# Patient Record
Sex: Male | Born: 1975 | Race: White | Hispanic: No | Marital: Single | Smoking: Current every day smoker
Health system: Southern US, Community
[De-identification: ages and names within clinical notes are randomized; demographics above are authoritative.]

## PROBLEM LIST (undated history)

## (undated) DIAGNOSIS — G43909 Migraine, unspecified, not intractable, without status migrainosus: Secondary | ICD-10-CM

## (undated) DIAGNOSIS — T2200XA Burn of unspecified degree of shoulder and upper limb, except wrist and hand, unspecified site, initial encounter: Secondary | ICD-10-CM

## (undated) DIAGNOSIS — S52509A Unspecified fracture of the lower end of unspecified radius, initial encounter for closed fracture: Secondary | ICD-10-CM

## (undated) DIAGNOSIS — S62309A Unspecified fracture of unspecified metacarpal bone, initial encounter for closed fracture: Secondary | ICD-10-CM

## (undated) HISTORY — PX: NO PAST SURGERIES: SHX2092

---

## 2012-02-12 ENCOUNTER — Emergency Department (HOSPITAL_COMMUNITY): Payer: BC Managed Care – PPO

## 2012-02-12 ENCOUNTER — Encounter (HOSPITAL_COMMUNITY): Payer: Self-pay | Admitting: Emergency Medicine

## 2012-02-12 ENCOUNTER — Emergency Department (HOSPITAL_COMMUNITY)
Admission: EM | Admit: 2012-02-12 | Discharge: 2012-02-12 | Disposition: A | Discharge status: Home / Self Care | Payer: BC Managed Care – PPO | Attending: Emergency Medicine | Admitting: Emergency Medicine

## 2012-02-12 DIAGNOSIS — S62319A Displaced fracture of base of unspecified metacarpal bone, initial encounter for closed fracture: Secondary | ICD-10-CM | POA: Insufficient documentation

## 2012-02-12 DIAGNOSIS — S52501A Unspecified fracture of the lower end of right radius, initial encounter for closed fracture: Secondary | ICD-10-CM

## 2012-02-12 DIAGNOSIS — S52509A Unspecified fracture of the lower end of unspecified radius, initial encounter for closed fracture: Secondary | ICD-10-CM | POA: Insufficient documentation

## 2012-02-12 DIAGNOSIS — W2209XA Striking against other stationary object, initial encounter: Secondary | ICD-10-CM | POA: Insufficient documentation

## 2012-02-12 DIAGNOSIS — F17200 Nicotine dependence, unspecified, uncomplicated: Secondary | ICD-10-CM | POA: Insufficient documentation

## 2012-02-12 DIAGNOSIS — Y929 Unspecified place or not applicable: Secondary | ICD-10-CM | POA: Insufficient documentation

## 2012-02-12 DIAGNOSIS — S62309A Unspecified fracture of unspecified metacarpal bone, initial encounter for closed fracture: Secondary | ICD-10-CM

## 2012-02-12 DIAGNOSIS — Y939 Activity, unspecified: Secondary | ICD-10-CM | POA: Insufficient documentation

## 2012-02-12 DIAGNOSIS — S62306A Unspecified fracture of fifth metacarpal bone, right hand, initial encounter for closed fracture: Secondary | ICD-10-CM

## 2012-02-12 HISTORY — DX: Unspecified fracture of the lower end of unspecified radius, initial encounter for closed fracture: S52.509A

## 2012-02-12 HISTORY — DX: Unspecified fracture of unspecified metacarpal bone, initial encounter for closed fracture: S62.309A

## 2012-02-12 MED ORDER — HYDROCODONE-ACETAMINOPHEN 5-325 MG PO TABS: ORAL_TABLET | ORAL | Status: DC

## 2012-02-12 NOTE — Progress Notes (Signed)
Orthopedic Tech Progress Note Patient Details:  Tommy Hatfield 1975-12-23 [id number]  Ortho Devices Type of Ortho Device: Arm sling;Sugartong splint   [name] 02/12/2012, 6:05 AM
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
## 2012-02-12 NOTE — ED Notes (Signed)
Received bedside report from [place], [place].  Patient updated on plan of care; patient currently waiting to be transported to x-ray; denies any needs at this time. Will continue to monitor.

## 2012-02-12 NOTE — ED Notes (Signed)
Ortho called back; states that he is on his way.
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
## 2012-02-12 NOTE — ED Notes (Signed)
Paged ortho for splint and arm sling.

## 2012-02-12 NOTE — ED Notes (Signed)
Patient given copy of discharge paperwork; went over discharge instructions with patient.  Patient instructed to take Vicodin as directed, to not drive/operate heavy machinery while taking Vicodin, to follow up with hand surgeon first thing Monday morning, and to return to the ED for new, worsening, or concerning symptoms.

## 2012-02-12 NOTE — ED Notes (Signed)
Patient currently asleep in bed; no respiratory or acute distress noted.  Will continue to monitor. 

## 2012-02-12 NOTE — ED Notes (Signed)
Patient currently resting quietly in bed; no respiratory or acute distress noted.  Patient updated on plan of care; informed patient that we are currently waiting on EDP to come in and talk about test results; patient denies any needs at this time; will continue to monitor.

## 2012-02-17 ENCOUNTER — Encounter (HOSPITAL_BASED_OUTPATIENT_CLINIC_OR_DEPARTMENT_OTHER): Payer: Self-pay | Admitting: *Deleted

## 2012-02-17 DIAGNOSIS — T2200XA Burn of unspecified degree of shoulder and upper limb, except wrist and hand, unspecified site, initial encounter: Secondary | ICD-10-CM

## 2012-02-17 HISTORY — DX: Burn of unspecified degree of shoulder and upper limb, except wrist and hand, unspecified site, initial encounter: T22.00XA

## 2012-02-20 ENCOUNTER — Ambulatory Visit (HOSPITAL_BASED_OUTPATIENT_CLINIC_OR_DEPARTMENT_OTHER): Payer: BC Managed Care – PPO | Admitting: Certified Registered Nurse Anesthetist

## 2012-02-20 ENCOUNTER — Encounter (HOSPITAL_BASED_OUTPATIENT_CLINIC_OR_DEPARTMENT_OTHER): Payer: Self-pay | Admitting: Certified Registered Nurse Anesthetist

## 2012-02-20 ENCOUNTER — Encounter (HOSPITAL_BASED_OUTPATIENT_CLINIC_OR_DEPARTMENT_OTHER)
Admission: RE | Disposition: A | Discharge status: Home / Self Care | Payer: Self-pay | Source: Ambulatory Visit | Attending: Orthopedic Surgery

## 2012-02-20 ENCOUNTER — Encounter (HOSPITAL_BASED_OUTPATIENT_CLINIC_OR_DEPARTMENT_OTHER): Payer: Self-pay

## 2012-02-20 ENCOUNTER — Ambulatory Visit (HOSPITAL_BASED_OUTPATIENT_CLINIC_OR_DEPARTMENT_OTHER)
Admission: RE | Admit: 2012-02-20 | Discharge: 2012-02-20 | Disposition: A | Discharge status: Home / Self Care | Payer: BC Managed Care – PPO | Source: Ambulatory Visit | Attending: Orthopedic Surgery | Admitting: Orthopedic Surgery

## 2012-02-20 DIAGNOSIS — S52599A Other fractures of lower end of unspecified radius, initial encounter for closed fracture: Secondary | ICD-10-CM | POA: Insufficient documentation

## 2012-02-20 DIAGNOSIS — S52531A Colles' fracture of right radius, initial encounter for closed fracture: Secondary | ICD-10-CM

## 2012-02-20 DIAGNOSIS — S62339A Displaced fracture of neck of unspecified metacarpal bone, initial encounter for closed fracture: Secondary | ICD-10-CM | POA: Insufficient documentation

## 2012-02-20 DIAGNOSIS — IMO0002 Reserved for concepts with insufficient information to code with codable children: Secondary | ICD-10-CM | POA: Insufficient documentation

## 2012-02-20 HISTORY — PX: OPEN REDUCTION INTERNAL FIXATION (ORIF) DISTAL RADIAL FRACTURE: SHX5989

## 2012-02-20 HISTORY — DX: Unspecified fracture of the lower end of unspecified radius, initial encounter for closed fracture: S52.509A

## 2012-02-20 HISTORY — PX: CLOSED REDUCTION FINGER WITH PERCUTANEOUS PINNING: SHX5612

## 2012-02-20 HISTORY — DX: Unspecified fracture of unspecified metacarpal bone, initial encounter for closed fracture: S62.309A

## 2012-02-20 HISTORY — DX: Burn of unspecified degree of shoulder and upper limb, except wrist and hand, unspecified site, initial encounter: T22.00XA

## 2012-02-20 LAB — POCT HEMOGLOBIN-HEMACUE: Hemoglobin: 16.7 g/dL (ref 13.0–17.0)

## 2012-02-20 SURGERY — OPEN REDUCTION INTERNAL FIXATION (ORIF) DISTAL RADIUS FRACTURE
Anesthesia: Regional | Site: Wrist | Laterality: Right | Wound class: Clean

## 2012-02-20 MED ORDER — FENTANYL CITRATE 0.05 MG/ML IJ SOLN
100.0000 ug | Freq: Once | INTRAMUSCULAR | Status: AC
  Administered 2012-02-20: 100 ug via INTRAVENOUS

## 2012-02-20 MED ORDER — MIDAZOLAM HCL 5 MG/5ML IJ SOLN
INTRAMUSCULAR | Status: DC | PRN
  Administered 2012-02-20: 2 mg via INTRAVENOUS

## 2012-02-20 MED ORDER — LACTATED RINGERS IV SOLN
INTRAVENOUS | Status: DC
  Administered 2012-02-20 (×2): via INTRAVENOUS

## 2012-02-20 MED ORDER — MEPERIDINE HCL 25 MG/ML IJ SOLN: 6.2500 mg | INTRAMUSCULAR | Status: DC | PRN

## 2012-02-20 MED ORDER — OXYCODONE-ACETAMINOPHEN 5-325 MG PO TABS: 1.0000 | ORAL_TABLET | ORAL | Status: DC | PRN

## 2012-02-20 MED ORDER — BUPIVACAINE-EPINEPHRINE PF 0.5-1:200000 % IJ SOLN
INTRAMUSCULAR | Status: DC | PRN
  Administered 2012-02-20: 30 mL

## 2012-02-20 MED ORDER — DEXAMETHASONE SODIUM PHOSPHATE 4 MG/ML IJ SOLN
INTRAMUSCULAR | Status: DC | PRN
  Administered 2012-02-20: 10 mg via INTRAVENOUS

## 2012-02-20 MED ORDER — ONDANSETRON HCL 4 MG/2ML IJ SOLN: 4.0000 mg | Freq: Once | INTRAMUSCULAR | Status: DC | PRN

## 2012-02-20 MED ORDER — MIDAZOLAM HCL 2 MG/2ML IJ SOLN
2.0000 mg | Freq: Once | INTRAMUSCULAR | Status: AC
  Administered 2012-02-20: 2 mg via INTRAVENOUS

## 2012-02-20 MED ORDER — ONDANSETRON HCL 4 MG/2ML IJ SOLN
INTRAMUSCULAR | Status: DC | PRN
  Administered 2012-02-20: 4 mg via INTRAVENOUS

## 2012-02-20 MED ORDER — OXYCODONE HCL 5 MG PO TABS: 5.0000 mg | ORAL_TABLET | Freq: Once | ORAL | Status: DC | PRN

## 2012-02-20 MED ORDER — 0.9 % SODIUM CHLORIDE (POUR BTL) OPTIME
TOPICAL | Status: DC | PRN
  Administered 2012-02-20: 1000 mL

## 2012-02-20 MED ORDER — FENTANYL CITRATE 0.05 MG/ML IJ SOLN
INTRAMUSCULAR | Status: DC | PRN
  Administered 2012-02-20: 25 ug via INTRAVENOUS

## 2012-02-20 MED ORDER — HYDROMORPHONE HCL PF 1 MG/ML IJ SOLN: 0.2500 mg | INTRAMUSCULAR | Status: DC | PRN

## 2012-02-20 MED ORDER — CEFAZOLIN SODIUM-DEXTROSE 2-3 GM-% IV SOLR
INTRAVENOUS | Status: DC | PRN
  Administered 2012-02-20: 2 g via INTRAVENOUS

## 2012-02-20 MED ORDER — PROPOFOL 10 MG/ML IV BOLUS
INTRAVENOUS | Status: DC | PRN
  Administered 2012-02-20: 150 mg via INTRAVENOUS

## 2012-02-20 MED ORDER — LIDOCAINE HCL (CARDIAC) 20 MG/ML IV SOLN
INTRAVENOUS | Status: DC | PRN
  Administered 2012-02-20: 60 mg via INTRAVENOUS

## 2012-02-20 MED ORDER — OXYCODONE HCL 5 MG/5ML PO SOLN: 5.0000 mg | Freq: Once | ORAL | Status: DC | PRN

## 2012-02-20 SURGICAL SUPPLY — 72 items
APL SKNCLS STERI-STRIP NONHPOA (GAUZE/BANDAGES/DRESSINGS) ×2
BAG DECANTER FOR FLEXI CONT (MISCELLANEOUS) IMPLANT
BANDAGE ELASTIC 3 VELCRO ST LF (GAUZE/BANDAGES/DRESSINGS) IMPLANT
BANDAGE ELASTIC 4 VELCRO ST LF (GAUZE/BANDAGES/DRESSINGS) ×3 IMPLANT
BANDAGE GAUZE ELAST BULKY 4 IN (GAUZE/BANDAGES/DRESSINGS) ×3 IMPLANT
BENZOIN TINCTURE PRP APPL 2/3 (GAUZE/BANDAGES/DRESSINGS) ×3 IMPLANT
BIT DRILL 2 FAST STEP (BIT) ×3 IMPLANT
BIT DRILL 2.5X4 QC (BIT) ×3 IMPLANT
BLADE MINI RND TIP GREEN BEAV (BLADE) IMPLANT
BLADE SURG 15 STRL LF DISP TIS (BLADE) ×4 IMPLANT
BLADE SURG 15 STRL SS (BLADE) ×6
BNDG CMPR 9X4 STRL LF SNTH (GAUZE/BANDAGES/DRESSINGS) ×2
BNDG ESMARK 4X9 LF (GAUZE/BANDAGES/DRESSINGS) ×3 IMPLANT
CANISTER SUCTION 1200CC (MISCELLANEOUS) IMPLANT
CLOTH BEACON ORANGE TIMEOUT ST (SAFETY) ×3 IMPLANT
CORDS BIPOLAR (ELECTRODE) ×3 IMPLANT
COVER TABLE BACK 60X90 (DRAPES) ×3 IMPLANT
CUFF TOURNIQUET SINGLE 18IN (TOURNIQUET CUFF) ×3 IMPLANT
DECANTER SPIKE VIAL GLASS SM (MISCELLANEOUS) IMPLANT
DRAPE EXTREMITY T 121X128X90 (DRAPE) ×3 IMPLANT
DRAPE OEC MINIVIEW 54X84 (DRAPES) ×3 IMPLANT
DRAPE SURG 17X23 STRL (DRAPES) ×3 IMPLANT
DURAPREP 26ML APPLICATOR (WOUND CARE) ×3 IMPLANT
ELECT REM PT RETURN 9FT ADLT (ELECTROSURGICAL)
ELECTRODE REM PT RTRN 9FT ADLT (ELECTROSURGICAL) IMPLANT
GAUZE SPONGE 4X4 16PLY XRAY LF (GAUZE/BANDAGES/DRESSINGS) IMPLANT
GAUZE XEROFORM 1X8 LF (GAUZE/BANDAGES/DRESSINGS) IMPLANT
GLOVE BIO SURGEON STRL SZ8.5 (GLOVE) ×3 IMPLANT
GLOVE BIOGEL M STRL SZ7.5 (GLOVE) ×3 IMPLANT
GLOVE BIOGEL PI IND STRL 8 (GLOVE) ×2 IMPLANT
GLOVE BIOGEL PI INDICATOR 8 (GLOVE) ×1
GOWN PREVENTION PLUS XLARGE (GOWN DISPOSABLE) IMPLANT
GOWN PREVENTION PLUS XXLARGE (GOWN DISPOSABLE) ×3 IMPLANT
GOWN STRL REIN XL XLG (GOWN DISPOSABLE) ×3 IMPLANT
K-WIRE .045X4 (WIRE) ×6 IMPLANT
NEEDLE HYPO 25X1 1.5 SAFETY (NEEDLE) IMPLANT
NS IRRIG 1000ML POUR BTL (IV SOLUTION) ×3 IMPLANT
PACK BASIN DAY SURGERY FS (CUSTOM PROCEDURE TRAY) ×3 IMPLANT
PAD CAST 3X4 CTTN HI CHSV (CAST SUPPLIES) ×2 IMPLANT
PAD CAST 4YDX4 CTTN HI CHSV (CAST SUPPLIES) IMPLANT
PADDING CAST ABS 4INX4YD NS (CAST SUPPLIES)
PADDING CAST ABS COTTON 4X4 ST (CAST SUPPLIES) IMPLANT
PADDING CAST COTTON 3X4 STRL (CAST SUPPLIES) ×3
PADDING CAST COTTON 4X4 STRL (CAST SUPPLIES)
PEG SUBCHONDRAL SMOOTH 2.0X22 (Peg) ×6 IMPLANT
PEG SUBCHONDRAL SMOOTH 2.0X24 (Peg) ×9 IMPLANT
PEG SUBCHONDRAL SMOOTH 2.0X26 (Peg) ×3 IMPLANT
PENCIL BUTTON HOLSTER BLD 10FT (ELECTRODE) IMPLANT
PLATE STAN 24.4X59.5 RT (Plate) ×3 IMPLANT
SCREW CORT 3.5X14 LNG (Screw) ×6 IMPLANT
SCREW CORT 3.5X16 LNG (Screw) ×3 IMPLANT
SHEET MEDIUM DRAPE 40X70 STRL (DRAPES) ×6 IMPLANT
SPLINT PLASTER CAST XFAST 3X15 (CAST SUPPLIES) IMPLANT
SPLINT PLASTER CAST XFAST 4X15 (CAST SUPPLIES) ×30 IMPLANT
SPLINT PLASTER XTRA FAST SET 4 (CAST SUPPLIES) ×15
SPLINT PLASTER XTRA FASTSET 3X (CAST SUPPLIES)
SPONGE GAUZE 4X4 12PLY (GAUZE/BANDAGES/DRESSINGS) ×3 IMPLANT
STOCKINETTE 4X48 STRL (DRAPES) ×3 IMPLANT
STRIP CLOSURE SKIN 1/2X4 (GAUZE/BANDAGES/DRESSINGS) ×45 IMPLANT
SUCTION FRAZIER TIP 10 FR DISP (SUCTIONS) IMPLANT
SUT ETHILON 4 0 PS 2 18 (SUTURE) IMPLANT
SUT MERSILENE 4 0 P 3 (SUTURE) IMPLANT
SUT PROLENE 3 0 PS 2 (SUTURE) IMPLANT
SUT SILK 2 0 FS (SUTURE) IMPLANT
SUT VIC AB 3-0 FS2 27 (SUTURE) IMPLANT
SUT VICRYL RAPIDE 4/0 PS 2 (SUTURE) IMPLANT
SYR BULB 3OZ (MISCELLANEOUS) ×3 IMPLANT
SYRINGE 10CC LL (SYRINGE) IMPLANT
TOWEL OR 17X24 6PK STRL BLUE (TOWEL DISPOSABLE) ×3 IMPLANT
TUBE CONNECTING 20X1/4 (TUBING) IMPLANT
UNDERPAD 30X30 INCONTINENT (UNDERPADS AND DIAPERS) ×3 IMPLANT
WATER STERILE IRR 1000ML POUR (IV SOLUTION) IMPLANT

## 2012-02-20 NOTE — Anesthesia Preprocedure Evaluation (Addendum)

## 2012-02-20 NOTE — Progress Notes (Signed)
Assisted Dr. Ossey with right, ultrasound guided, supraclavicular block. Side rails up, monitors on throughout procedure. See vital signs in flow sheet. Tolerated Procedure well. 

## 2012-02-20 NOTE — Transfer of Care (Signed)
Immediate Anesthesia Transfer of Care Note  Patient: Tommy Hatfield  Procedure(s) Performed: Procedure(s) with comments: OPEN REDUCTION INTERNAL FIXATION (ORIF) DISTAL RADIAL FRACTURE (Right) - AND PINNING OF 5TH METACARPAL RIGHT SIDE  Patient Location: PACU  Anesthesia Type:GA combined with regional for post-op pain  Level of Consciousness: awake and patient cooperative  Airway & Oxygen Therapy: Patient Spontanous Breathing and Patient connected to face mask oxygen  Post-op Assessment: Report given to PACU RN and Post -op Vital signs reviewed and stable  Post vital signs: Reviewed and stable  Complications: No apparent anesthesia complications
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
## 2012-02-20 NOTE — Anesthesia Procedure Notes (Addendum)
Anesthesia Regional Block:  Supraclavicular block  Pre-Anesthetic Checklist: ,, timeout performed, Correct Patient, Correct Site, Correct Laterality, Correct Procedure, Correct Position, site marked, Risks and benefits discussed,  Surgical consent,  Pre-op evaluation,  At surgeon's request and post-op pain management  Laterality: Right  Prep: chloraprep       Needles:   Needle Type: Echogenic Stimulator Needle     Needle Length: 9cm      Additional Needles:  Procedures: ultrasound guided (picture in chart) and nerve stimulator Supraclavicular block  Nerve Stimulator or Paresthesia:  Response: 0.4 mA,   Additional Responses:   Narrative:  Start time: 02/20/2012 1:16 PM End time: 02/20/2012 1:25 PM Injection made incrementally with aspirations every 5 mL.  Performed by: Personally  Anesthesiologist: Arta Bruce MD  Additional Notes: Monitors applied. Patient sedated. Sterile prep and drape,hand hygiene and sterile gloves were used. Relevant anatomy identified.Needle position confirmed.Local anesthetic injected incrementally after negative aspiration. Local anesthetic spread visualized around nerve(s). Vascular puncture avoided. No complications. Image printed for medical record.The patient tolerated the procedure well.       Supraclavicular block Procedure Name: LMA Insertion Date/Time: 02/20/2012 3:09 PM Performed by: Lequita Meadowcroft D Pre-anesthesia Checklist: Patient identified, Emergency Drugs available, Suction available and Patient being monitored Patient Re-evaluated:Patient Re-evaluated prior to inductionOxygen Delivery Method: Circle System Utilized Preoxygenation: Pre-oxygenation with 100% oxygen Intubation Type: IV induction Ventilation: Mask ventilation without difficulty LMA: LMA inserted LMA Size: 5.0 Number of attempts: 1 Airway Equipment and Method: bite block Placement Confirmation: positive ETCO2 Tube secured with: Tape Dental Injury: Teeth and  Oropharynx as per pre-operative assessment

## 2012-02-20 NOTE — Op Note (Signed)
See dictated note [phone]

## 2012-02-21 NOTE — Anesthesia Postprocedure Evaluation (Signed)
Anesthesia Post Note  Patient: Tommy Hatfield  Procedure(s) Performed: Procedure(s) (LRB): OPEN REDUCTION INTERNAL FIXATION (ORIF) DISTAL RADIAL FRACTURE (Right) CLOSED REDUCTION FINGER WITH PERCUTANEOUS PINNING (Right)  Anesthesia type: general  Patient location: PACU  Post pain: Pain level controlled  Post assessment: Patient's Cardiovascular Status Stable  Last Vitals:  Filed Vitals:   02/20/12 1714  BP: 123/77  Pulse: 92  Temp: 37 C  Resp: 16    Post vital signs: Reviewed and stable  Level of consciousness: sedated  Complications: No apparent anesthesia complications

## 2012-02-21 NOTE — Op Note (Signed)
Tommy Hatfield, Tommy Hatfield              ACCOUNT NO.:  [id number]  MEDICAL RECORD NO.:  [id number]  LOCATION:                                 FACILITY:  PHYSICIAN:  Artist Pais. Raahil Ong, M.D.DATE OF BIRTH:  02/10/1975  DATE OF PROCEDURE:  02/20/2012 DATE OF DISCHARGE:                              OPERATIVE REPORT   PREOPERATIVE DIAGNOSIS:  Displaced intra-articular fracture distal radius, right side and displaced small metacarpal fracture, right side.  POSTOPERATIVE DIAGNOSIS:  Displaced intra-articular fracture distal radius, right side and displaced small metacarpal fracture, right side.  PROCEDURE:  Open reduction and internal fixation, right distal radius fracture with DVR plate and screws, release of brachioradialis muscle, and then closed reduction and percutaneous pinning of small metacarpal fracture, right side.  SURGEON:  Artist Pais. Mina Marble, M.D.  ASSISTANT:  None.  ANESTHESIA:  Supraclavicular block and general.  COMPLICATIONS:  No complications.  DRAINS:  No drains.  DESCRIPTION OF PROCEDURE:  The patient was taken operating suite.  After induction of adequate general anesthesia, right upper extremity was prepped and draped in usual sterile fashion.  An Esmarch was used to exsanguinate the limb.  Tourniquet was inflated to 250 mmHg.  At this point in time, a standard volar approach to the distal radius was undertaken.  Skin was incised between the FCR and the radial artery. This level was developed down the level of pronator quadratus, it was subperiosteally split revealing a volarly displaced three-part distal radius fracture.  The brachioradialis was released off the distal fragment to aid in reduction.  Reduction was performed with extension over some rolled up towels.  Standard DVR plate was placed on the lower aspect, and a drill was used to make a hole through the slotted hole. The plate was then fastened down to the volar aspect of the distal radius reducing  the fracture.  It was adjusted using fluoroscopy.  Two cortical screws were placed proximally, 2 more cortical screws proximally followed by smooth pegs distally.  Intraoperative fluoroscopy revealed adequate reduction in the AP, lateral, and oblique view.  The wound was irrigated and loosely closed in layers of 2-0 undyed Vicryl for the pronator, 4-0 Vicryl Rapide subcuticular stitch on the skin. Steri-Strips were applied.  The patient then had a closed reduction of the small finger metacarpal fracture using a Jahss maneuver.  Two 0.45 K- wires were driven from ulnar to radial from the 5th metacarpal head and neck junction into the 4th to maintain reduction.  The K-wires were cut outside the skin, dressed with Xeroform.  The patient was then placed in sterile dressing of 4x4s, fluffs, and a volar splint.  The patient tolerated the procedure well and went to the recovery room in a stable fashion.     Artist Pais Mina Marble, M.D.     MAW/MEDQ  D:  02/20/2012  T:  02/21/2012  Job:  [id number]

## 2012-02-22 ENCOUNTER — Encounter (HOSPITAL_BASED_OUTPATIENT_CLINIC_OR_DEPARTMENT_OTHER): Payer: Self-pay | Admitting: Orthopedic Surgery

## 2013-05-01 ENCOUNTER — Emergency Department (HOSPITAL_COMMUNITY)
Admission: EM | Admit: 2013-05-01 | Discharge: 2013-05-01 | Disposition: A | Discharge status: Home / Self Care | Payer: BC Managed Care – PPO | Attending: Emergency Medicine | Admitting: Emergency Medicine

## 2013-05-01 ENCOUNTER — Encounter (HOSPITAL_COMMUNITY): Payer: Self-pay | Admitting: Emergency Medicine

## 2013-05-01 DIAGNOSIS — F17200 Nicotine dependence, unspecified, uncomplicated: Secondary | ICD-10-CM | POA: Insufficient documentation

## 2013-05-01 DIAGNOSIS — Z8679 Personal history of other diseases of the circulatory system: Secondary | ICD-10-CM | POA: Insufficient documentation

## 2013-05-01 DIAGNOSIS — Z8781 Personal history of (healed) traumatic fracture: Secondary | ICD-10-CM | POA: Insufficient documentation

## 2013-05-01 DIAGNOSIS — Z87828 Personal history of other (healed) physical injury and trauma: Secondary | ICD-10-CM | POA: Insufficient documentation

## 2013-05-01 DIAGNOSIS — G44009 Cluster headache syndrome, unspecified, not intractable: Secondary | ICD-10-CM | POA: Insufficient documentation

## 2013-05-01 HISTORY — DX: Migraine, unspecified, not intractable, without status migrainosus: G43.909

## 2013-05-01 MED ORDER — SODIUM CHLORIDE 0.9 % IV SOLN
1000.0000 mL | INTRAVENOUS | Status: DC
  Administered 2013-05-01: 1000 mL via INTRAVENOUS

## 2013-05-01 MED ORDER — DEXAMETHASONE SODIUM PHOSPHATE 10 MG/ML IJ SOLN
10.0000 mg | Freq: Once | INTRAMUSCULAR | Status: AC
  Administered 2013-05-01: 10 mg via INTRAMUSCULAR
  Filled 2013-05-01: qty 1

## 2013-05-01 MED ORDER — KETOROLAC TROMETHAMINE 30 MG/ML IJ SOLN
30.0000 mg | Freq: Once | INTRAMUSCULAR | Status: DC
Start: 2013-05-01 — End: 2013-05-01

## 2013-05-01 MED ORDER — METOCLOPRAMIDE HCL 5 MG/ML IJ SOLN
10.0000 mg | INTRAMUSCULAR | Status: AC
  Administered 2013-05-01: 10 mg via INTRAMUSCULAR
  Filled 2013-05-01: qty 2

## 2013-05-01 MED ORDER — KETOROLAC TROMETHAMINE 30 MG/ML IJ SOLN
30.0000 mg | Freq: Once | INTRAMUSCULAR | Status: AC
  Administered 2013-05-01: 30 mg via INTRAVENOUS
  Filled 2013-05-01: qty 1

## 2013-05-01 MED ORDER — KETOROLAC TROMETHAMINE 10 MG PO TABS: 10.0000 mg | ORAL_TABLET | Freq: Four times a day (QID) | ORAL | Status: AC | PRN

## 2013-05-01 MED ORDER — SODIUM CHLORIDE 0.9 % IV SOLN
1000.0000 mL | Freq: Once | INTRAVENOUS | Status: AC
  Administered 2013-05-01: 1000 mL via INTRAVENOUS

## 2013-05-01 NOTE — ED Notes (Signed)
Pt. reports progressing migraine headache onset this evening , no nausea or vomitting .

## 2013-05-01 NOTE — Discharge Instructions (Signed)
Cluster Headache Cluster headaches are recognized by their pattern of deep, intense head pain. They normally occur on one side of your head, but they may "switch sides" in subsequent episodes. Typically, cluster headaches:   Are severe in nature.   Occur repeatedly over weeks to months and are followed by periods of no headaches.   Can last from 15 minutes to 3 hours.   Occur at the same time each day, often at night.   Occur several times a day. CAUSES The exact cause of cluster headaches is not known. Alcohol use may be associated with cluster headaches. SIGNS AND SYMPTOMS   Severe pain that begins in or around your eye or temple.   One-sided head pain.   Feeling sick to your stomach (nauseous).   Sensitivity to light.   Runny nose.   Eye redness, tearing, and nasal stuffiness on the side of your head where you are experiencing pain.   Sweaty, pale skin of the face.   Droopy or swollen eyelid.   Restlessness. DIAGNOSIS  Cluster headaches are diagnosed based on symptoms and a physical exam. Your health care provider may order a CT scan or an MRI of your head or lab tests to see if your headaches are caused by other medical conditions.  TREATMENT   Medicines for pain relief and to prevent recurrent attacks. Some people may need a combination of medicines.  Oxygen for pain relief.   Biofeedback programs to help reduce headache pain.  It may be helpful to keep a headache diary. This may help you find a trend for what is triggering your headaches. Your health care provider can develop a treatment plan.  HOME CARE INSTRUCTIONS  During cluster periods:   Follow a regular sleep schedule. Do not vary the amount and time that you sleep from day to day. It is important to stay on the same schedule during a cluster period to help prevent headaches.   Avoid alcohol.   Stop smoking if you smoke.  SEEK MEDICAL CARE IF:  You have any changes from your previous  cluster headaches either in intensity or frequency.   You are not getting relief from medicines you are taking.  SEEK IMMEDIATE MEDICAL CARE IF:   You faint.   You have weakness or numbness, especially on one side of your body or face.   You have double vision.   You have nausea or vomiting that is not relieved within several hours.   You cannot keep your balance or have difficulty talking or walking.   You have neck pain or stiffness.   You have a fever. MAKE SURE YOU:  Understand these instructions.   Will watch your condition.   Will get help right away if you are not doing well or get worse. Document Released: 12/27/2004 Document Revised: 10/17/2012 Document Reviewed: 07/19/2012 [facility] Patient Information 2014 [employer], [place].  Ketorolac tablets What is this medicine? KETOROLAC (kee toe ROLE ak) is a non-steroidal anti-inflammatory drug (NSAID). It is used for a short while to treat moderate to severe pain, including pain after surgery. It should not be used for more than 5 days. This medicine may be used for other purposes; ask your health care provider or pharmacist if you have questions. COMMON BRAND NAME(S): Toradol What should I tell my health care provider before I take this medicine? They need to know if you have any of these conditions: -asthma -bleeding problems like hemophilia -cigarette smoker -drink more than 3 alcohol containing drinks a  day -heart disease or circulation problems such as heart failure or leg edema (fluid retention) -high blood pressure -kidney disease -liver disease -stomach bleeding or ulcers -an unusual or allergic reaction to ketorolac, aspirin, other NSAIDs, other medicines, foods, dyes, or preservatives -pregnant or trying to get pregnant -breast-feeding How should I use this medicine? Take this medicine by mouth with a full glass of water. Follow the directions on the prescription label. Take your medicine at regular  intervals. Do not take your medicine more often than directed. Do not take more than the recommended dose. A special MedGuide will be given to you by the pharmacist with each prescription and refill. Be sure to read this information carefully each time. Talk to your pediatrician regarding the use of this medicine in children. While this drug may be prescribed for children as young as 39 years of age for selected conditions, precautions do apply. Patients over 39 years old may have a stronger reaction and need a smaller dose. Overdosage: If you think you have taken too much of this medicine contact a poison control center or emergency room at once. NOTE: This medicine is only for you. Do not share this medicine with others. What if I miss a dose? If you miss a dose, take it as soon as you can. If it is almost time for your next dose, take only that dose. Do not take double or extra doses. What may interact with this medicine? Do not take this medicine with any of the following medications: -aspirin and aspirin-like medicines -cidofovir -methotrexate -NSAIDs, medicines for pain and inflammation, like ibuprofen or naproxen -pemetrexed -probenecid This medicine may also interact with the following medications: -alcohol -alendronate -alprazolam -carbamazepine -cyclosporine -diuretics -flavocoxid -fluoxetine -ginkgo -lithium -medicines for high blood pressure like enalapril -medicines that affect platelets like pentoxifylline -medicines that treat or prevent blood clots like heparin, warfarin -muscle relaxants -phenytoin -steroid medicines like prednisone or cortisone -thiothixene This list may not describe all possible interactions. Give your health care provider a list of all the medicines, herbs, non-prescription drugs, or dietary supplements you use. Also tell them if you smoke, drink alcohol, or use illegal drugs. Some items may interact with your medicine. What should I watch for  while using this medicine? Tell your doctor or health care professional if your pain does not get better. Talk to your doctor before taking another medicine for pain. Do not treat yourself. This medicine does not prevent heart attack or stroke. In fact, this medicine may increase the chance of a heart attack or stroke. The chance may increase with longer use of this medicine and in people who have heart disease. If you take aspirin to prevent heart attack or stroke, talk with your doctor or health care professional. Do not take medicines such as ibuprofen and naproxen with this medicine. Side effects such as stomach upset, nausea, or ulcers may be more likely to occur. Many medicines available without a prescription should not be taken with this medicine. This medicine can cause ulcers and bleeding in the stomach and intestines at any time during treatment. Do not smoke cigarettes or drink alcohol. These increase irritation to your stomach and can make it more susceptible to damage from this medicine. Ulcers and bleeding can happen without warning symptoms and can cause death. You may get drowsy or dizzy. Do not drive, use machinery, or do anything that needs mental alertness until you know how this medicine affects you. Do not stand or sit up quickly, especially  if you are an older patient. This reduces the risk of dizzy or fainting spells. This medicine can cause you to bleed more easily. Try to avoid damage to your teeth and gums when you brush or floss your teeth. What side effects may I notice from receiving this medicine? Side effects that you should report to your doctor or health care professional as soon as possible: -allergic reactions like skin rash, itching or hives, swelling of the face, lips, or tongue -breathing problems -high blood pressure -nausea, vomiting -redness, blistering, peeling or loosening of the skin, including inside the mouth -severe stomach pain -signs and symptoms of  bleeding such as bloody or black, tarry stools; red or dark-brown urine; spitting up blood or brown material that looks like coffee grounds; red spots on the skin; unusual bruising or bleeding from the eye, gums, or nose -signs and symptoms of a stroke like changes in vision; confusion; trouble speaking or understanding; severe headaches; sudden numbness or weakness of the face, arm or leg; trouble walking; dizziness; loss of balance or coordination -trouble passing urine or change in the amount of urine -unexplained weight gain or swelling -unusually weak or tired -yellowing of eyes or skin  Side effects that usually do not require medical attention (report to your doctor or health care professional if they continue or are bothersome): -diarrhea -dizziness -headache -heartburn This list may not describe all possible side effects. Call your doctor for medical advice about side effects. You may report side effects to FDA at 1-800-FDA-1088. Where should I keep my medicine? Keep out of the reach of children. Store at room temperature between 20 and 25 degrees C (68 and 77 degrees F). Throw away any unused medicine after the expiration date. NOTE: This sheet is a summary. It may not cover all possible information. If you have questions about this medicine, talk to your doctor, pharmacist, or health care provider.  2014, [employer]. (2012-05-15 16:36:05)
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
## 2013-06-30 IMAGING — CR DG WRIST COMPLETE 3+V*R*
4 series · 4 of 4 positions shown · non-contrast
Comparison: None.

CLINICAL DATA: Wrist pain after injury.

RIGHT WRIST - COMPLETE 3+ VIEW

[x wrist obl right]
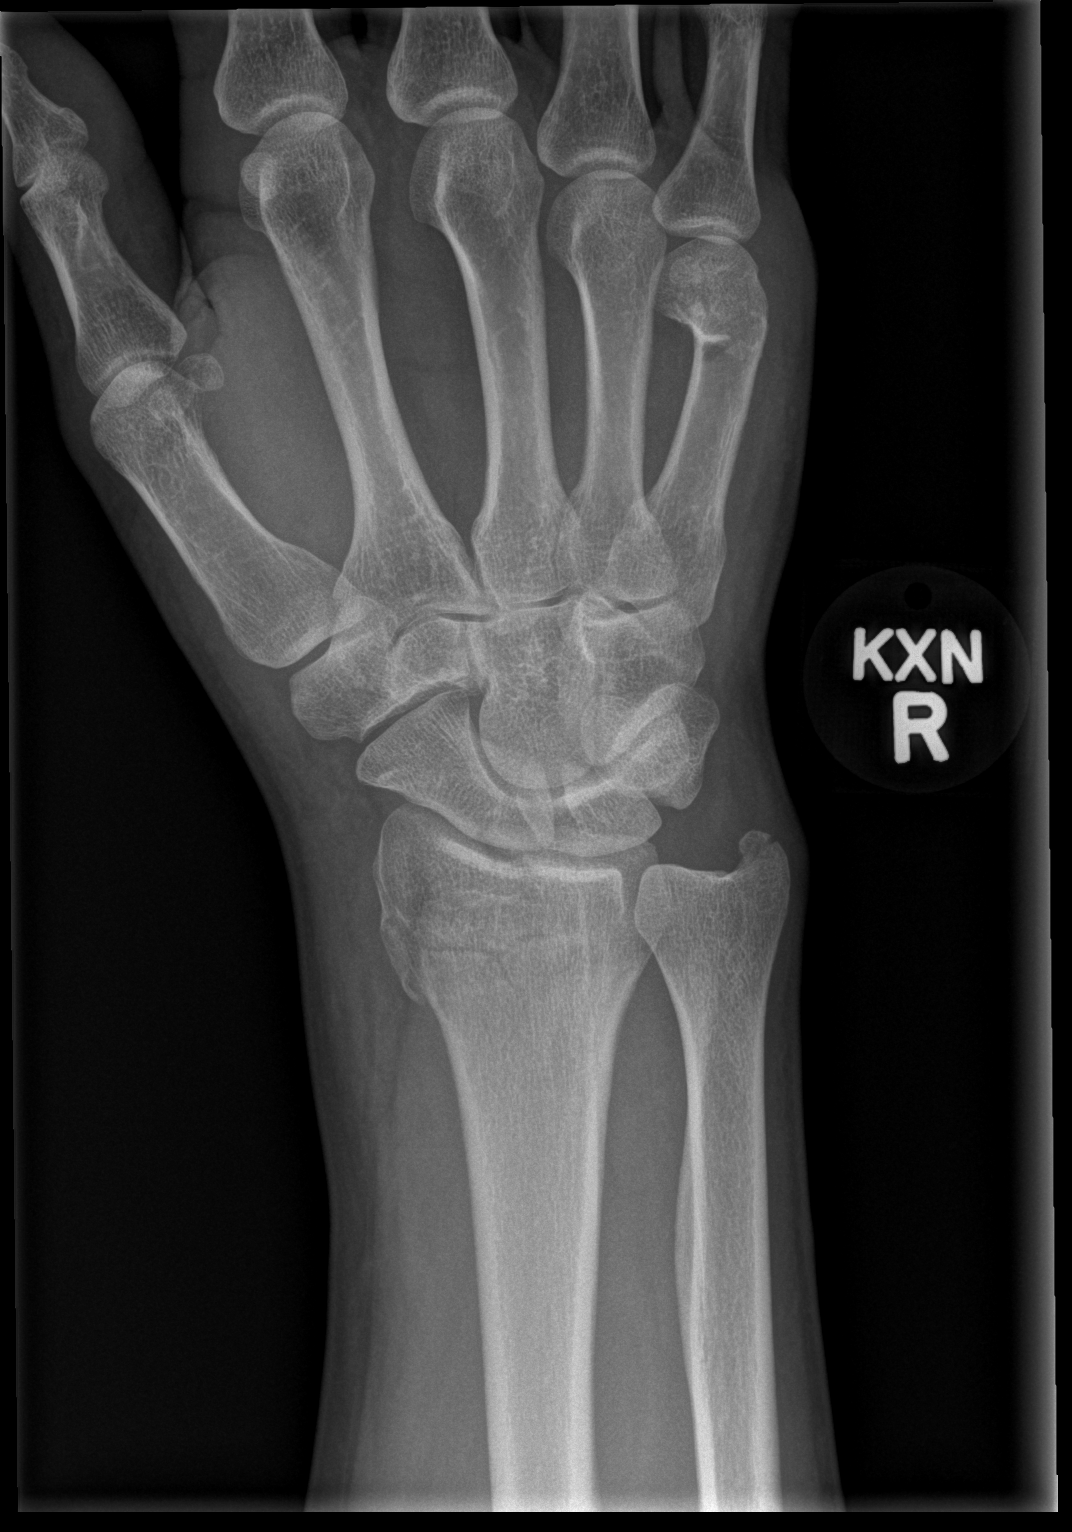

[x wrist pa right]
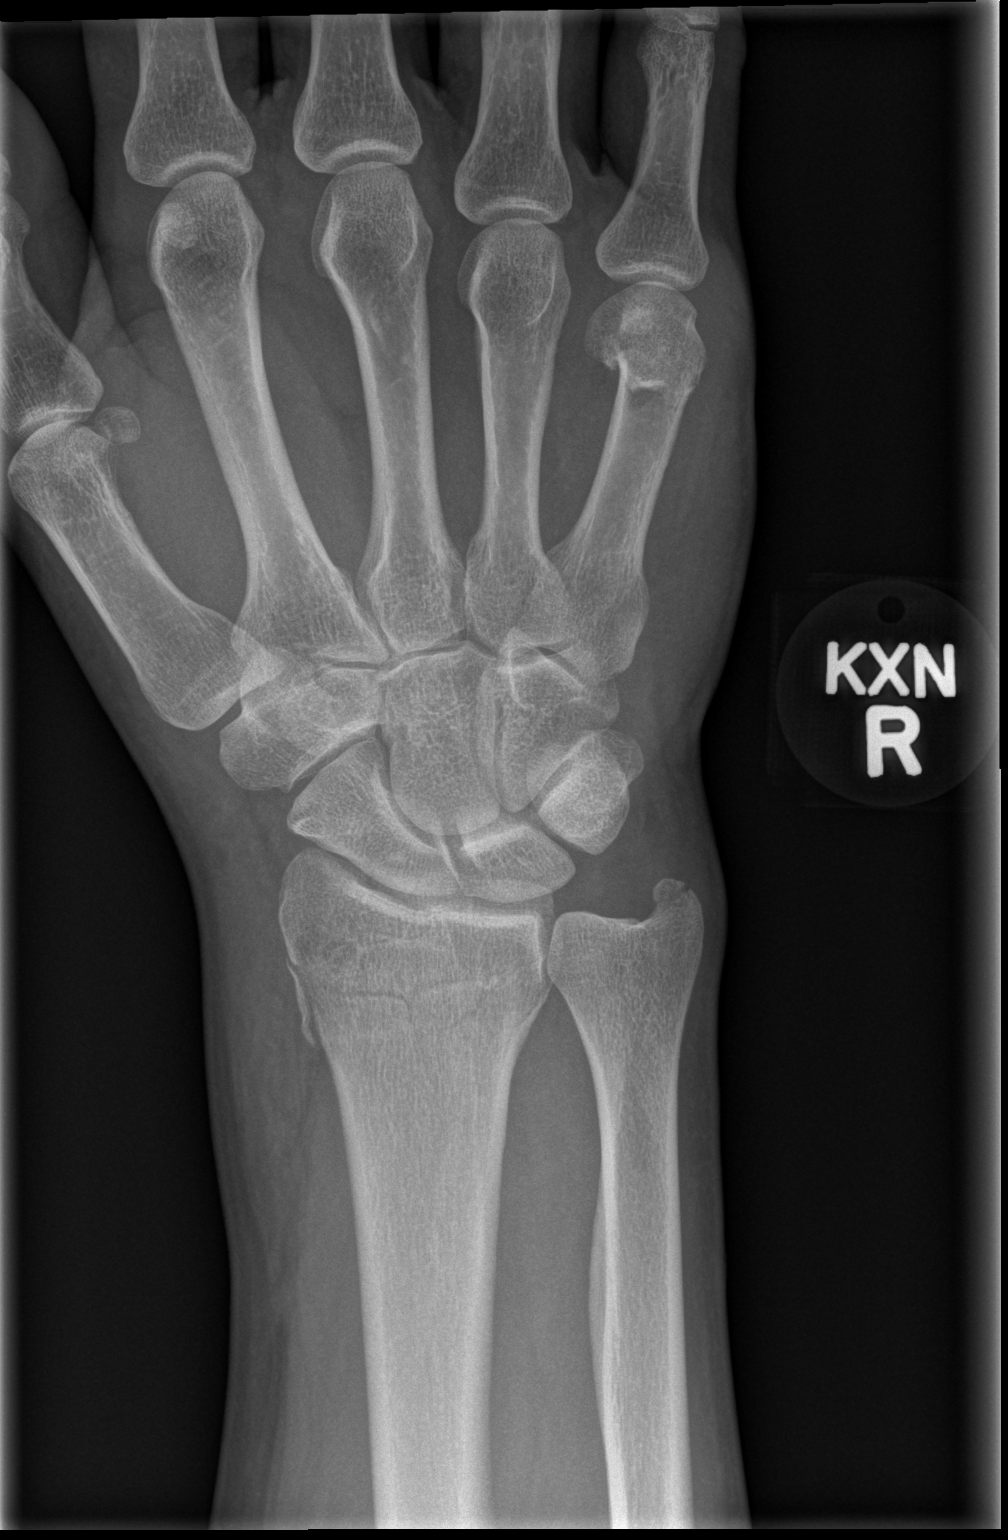

[x wrist navicular view right]
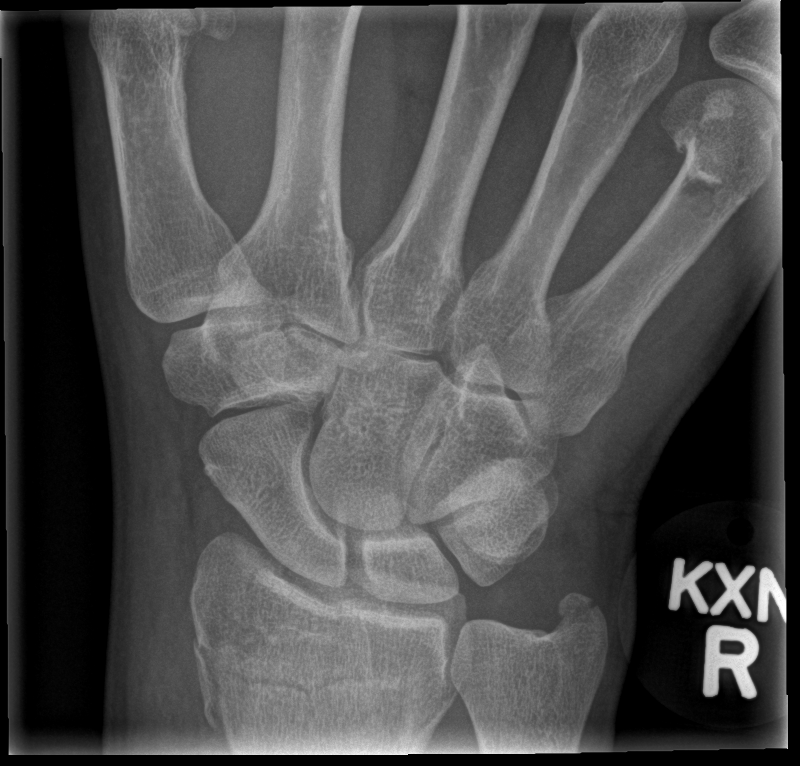

[x wrist lat right]
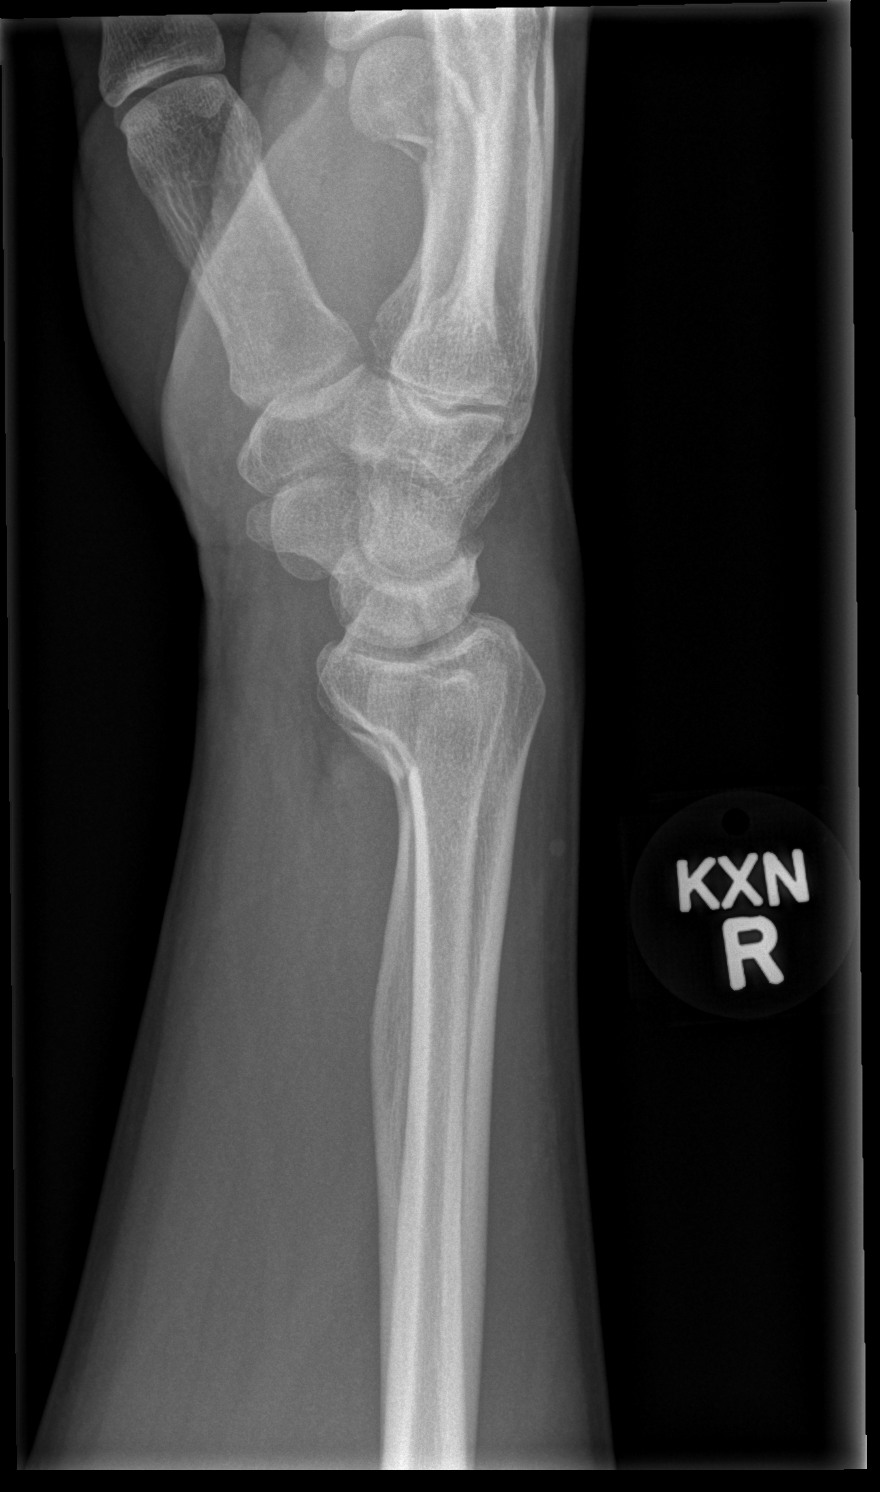

[4 of 4 positions shown; findings below may reference images not displayed]

FINDINGS: Transverse comminuted fractures of the distal right
radial metaphysis with fracture lines extending to the radial
carpal and radial ulnar joints.  There is associated fracture of
the right ulnar styloid process.  Mild volar angulation of the
distal fracture fragments.  The carpal bones appear intact.  There
is a fracture of the distal right fifth metacarpal bone with volar
angulation of the distal fracture fragments.
IMPRESSION: Fractures of the distal right radius and ulna and of the right
fifth metacarpal bone as described.

## 2021-10-12 ENCOUNTER — Ambulatory Visit: Admission: EM | Admit: 2021-10-12 | Discharge: 2021-10-12 | Discharge status: Against Medical Advice | Payer: Self-pay
